# Patient Record
Sex: Female | Born: 1995 | Hispanic: No | Marital: Single | Smoking: Never smoker
Health system: Southern US, Community
[De-identification: ages and names within clinical notes are randomized; demographics above are authoritative.]

## PROBLEM LIST (undated history)

## (undated) DIAGNOSIS — F319 Bipolar disorder, unspecified: Secondary | ICD-10-CM

## (undated) HISTORY — PX: NO PAST SURGERIES: SHX2092

---

## 2019-01-27 DIAGNOSIS — F509 Eating disorder, unspecified: Secondary | ICD-10-CM | POA: Diagnosis not present

## 2019-01-27 DIAGNOSIS — F4310 Post-traumatic stress disorder, unspecified: Secondary | ICD-10-CM | POA: Diagnosis not present

## 2019-01-27 DIAGNOSIS — F3112 Bipolar disorder, current episode manic without psychotic features, moderate: Secondary | ICD-10-CM | POA: Diagnosis not present

## 2019-02-03 DIAGNOSIS — F3112 Bipolar disorder, current episode manic without psychotic features, moderate: Secondary | ICD-10-CM | POA: Diagnosis not present

## 2019-02-03 DIAGNOSIS — F4310 Post-traumatic stress disorder, unspecified: Secondary | ICD-10-CM | POA: Diagnosis not present

## 2019-02-03 DIAGNOSIS — F509 Eating disorder, unspecified: Secondary | ICD-10-CM | POA: Diagnosis not present

## 2019-02-17 DIAGNOSIS — F4310 Post-traumatic stress disorder, unspecified: Secondary | ICD-10-CM | POA: Diagnosis not present

## 2019-02-17 DIAGNOSIS — F509 Eating disorder, unspecified: Secondary | ICD-10-CM | POA: Diagnosis not present

## 2019-02-17 DIAGNOSIS — F3112 Bipolar disorder, current episode manic without psychotic features, moderate: Secondary | ICD-10-CM | POA: Diagnosis not present

## 2019-02-24 DIAGNOSIS — F4310 Post-traumatic stress disorder, unspecified: Secondary | ICD-10-CM | POA: Diagnosis not present

## 2019-02-24 DIAGNOSIS — F509 Eating disorder, unspecified: Secondary | ICD-10-CM | POA: Diagnosis not present

## 2019-02-24 DIAGNOSIS — F3112 Bipolar disorder, current episode manic without psychotic features, moderate: Secondary | ICD-10-CM | POA: Diagnosis not present

## 2019-03-14 DIAGNOSIS — Z113 Encounter for screening for infections with a predominantly sexual mode of transmission: Secondary | ICD-10-CM | POA: Diagnosis not present

## 2019-03-14 DIAGNOSIS — R102 Pelvic and perineal pain: Secondary | ICD-10-CM | POA: Diagnosis not present

## 2019-03-16 DIAGNOSIS — F3112 Bipolar disorder, current episode manic without psychotic features, moderate: Secondary | ICD-10-CM | POA: Diagnosis not present

## 2019-03-16 DIAGNOSIS — F4310 Post-traumatic stress disorder, unspecified: Secondary | ICD-10-CM | POA: Diagnosis not present

## 2019-03-16 DIAGNOSIS — F509 Eating disorder, unspecified: Secondary | ICD-10-CM | POA: Diagnosis not present

## 2019-03-17 DIAGNOSIS — F509 Eating disorder, unspecified: Secondary | ICD-10-CM | POA: Diagnosis not present

## 2019-03-17 DIAGNOSIS — F4310 Post-traumatic stress disorder, unspecified: Secondary | ICD-10-CM | POA: Diagnosis not present

## 2019-03-17 DIAGNOSIS — F3112 Bipolar disorder, current episode manic without psychotic features, moderate: Secondary | ICD-10-CM | POA: Diagnosis not present

## 2019-03-24 DIAGNOSIS — F509 Eating disorder, unspecified: Secondary | ICD-10-CM | POA: Diagnosis not present

## 2019-03-24 DIAGNOSIS — F4310 Post-traumatic stress disorder, unspecified: Secondary | ICD-10-CM | POA: Diagnosis not present

## 2019-03-24 DIAGNOSIS — F3112 Bipolar disorder, current episode manic without psychotic features, moderate: Secondary | ICD-10-CM | POA: Diagnosis not present

## 2019-03-29 DIAGNOSIS — F3112 Bipolar disorder, current episode manic without psychotic features, moderate: Secondary | ICD-10-CM | POA: Diagnosis not present

## 2019-03-29 DIAGNOSIS — F509 Eating disorder, unspecified: Secondary | ICD-10-CM | POA: Diagnosis not present

## 2019-03-29 DIAGNOSIS — F4310 Post-traumatic stress disorder, unspecified: Secondary | ICD-10-CM | POA: Diagnosis not present

## 2019-04-06 DIAGNOSIS — R52 Pain, unspecified: Secondary | ICD-10-CM | POA: Diagnosis not present

## 2019-04-28 DIAGNOSIS — F3112 Bipolar disorder, current episode manic without psychotic features, moderate: Secondary | ICD-10-CM | POA: Diagnosis not present

## 2019-04-28 DIAGNOSIS — F509 Eating disorder, unspecified: Secondary | ICD-10-CM | POA: Diagnosis not present

## 2019-04-28 DIAGNOSIS — F4310 Post-traumatic stress disorder, unspecified: Secondary | ICD-10-CM | POA: Diagnosis not present

## 2019-05-05 DIAGNOSIS — F509 Eating disorder, unspecified: Secondary | ICD-10-CM | POA: Diagnosis not present

## 2019-05-05 DIAGNOSIS — F4310 Post-traumatic stress disorder, unspecified: Secondary | ICD-10-CM | POA: Diagnosis not present

## 2019-05-05 DIAGNOSIS — F3112 Bipolar disorder, current episode manic without psychotic features, moderate: Secondary | ICD-10-CM | POA: Diagnosis not present

## 2019-05-16 DIAGNOSIS — F4310 Post-traumatic stress disorder, unspecified: Secondary | ICD-10-CM | POA: Diagnosis not present

## 2019-05-16 DIAGNOSIS — F509 Eating disorder, unspecified: Secondary | ICD-10-CM | POA: Diagnosis not present

## 2019-05-16 DIAGNOSIS — F3112 Bipolar disorder, current episode manic without psychotic features, moderate: Secondary | ICD-10-CM | POA: Diagnosis not present

## 2019-05-23 DIAGNOSIS — F3112 Bipolar disorder, current episode manic without psychotic features, moderate: Secondary | ICD-10-CM | POA: Diagnosis not present

## 2019-05-23 DIAGNOSIS — F4310 Post-traumatic stress disorder, unspecified: Secondary | ICD-10-CM | POA: Diagnosis not present

## 2019-05-23 DIAGNOSIS — F509 Eating disorder, unspecified: Secondary | ICD-10-CM | POA: Diagnosis not present

## 2019-05-30 DIAGNOSIS — F509 Eating disorder, unspecified: Secondary | ICD-10-CM | POA: Diagnosis not present

## 2019-05-30 DIAGNOSIS — F4310 Post-traumatic stress disorder, unspecified: Secondary | ICD-10-CM | POA: Diagnosis not present

## 2019-05-30 DIAGNOSIS — F3112 Bipolar disorder, current episode manic without psychotic features, moderate: Secondary | ICD-10-CM | POA: Diagnosis not present

## 2019-06-06 DIAGNOSIS — F4310 Post-traumatic stress disorder, unspecified: Secondary | ICD-10-CM | POA: Diagnosis not present

## 2019-06-06 DIAGNOSIS — F509 Eating disorder, unspecified: Secondary | ICD-10-CM | POA: Diagnosis not present

## 2019-06-06 DIAGNOSIS — F3112 Bipolar disorder, current episode manic without psychotic features, moderate: Secondary | ICD-10-CM | POA: Diagnosis not present

## 2019-07-18 DIAGNOSIS — F509 Eating disorder, unspecified: Secondary | ICD-10-CM | POA: Diagnosis not present

## 2019-07-18 DIAGNOSIS — F4310 Post-traumatic stress disorder, unspecified: Secondary | ICD-10-CM | POA: Diagnosis not present

## 2019-07-18 DIAGNOSIS — F3112 Bipolar disorder, current episode manic without psychotic features, moderate: Secondary | ICD-10-CM | POA: Diagnosis not present

## 2019-08-08 DIAGNOSIS — F3112 Bipolar disorder, current episode manic without psychotic features, moderate: Secondary | ICD-10-CM | POA: Diagnosis not present

## 2019-08-08 DIAGNOSIS — F509 Eating disorder, unspecified: Secondary | ICD-10-CM | POA: Diagnosis not present

## 2019-08-08 DIAGNOSIS — F4310 Post-traumatic stress disorder, unspecified: Secondary | ICD-10-CM | POA: Diagnosis not present

## 2019-08-14 DIAGNOSIS — F3112 Bipolar disorder, current episode manic without psychotic features, moderate: Secondary | ICD-10-CM | POA: Diagnosis not present

## 2019-08-14 DIAGNOSIS — F4310 Post-traumatic stress disorder, unspecified: Secondary | ICD-10-CM | POA: Diagnosis not present

## 2019-08-14 DIAGNOSIS — F509 Eating disorder, unspecified: Secondary | ICD-10-CM | POA: Diagnosis not present

## 2019-08-22 ENCOUNTER — Inpatient Hospital Stay (HOSPITAL_COMMUNITY): Payer: BC Managed Care – PPO

## 2019-08-22 ENCOUNTER — Inpatient Hospital Stay (HOSPITAL_COMMUNITY)
Admission: AD | Admit: 2019-08-22 | Discharge: 2019-08-22 | Disposition: A | Discharge status: Home / Self Care | Payer: BC Managed Care – PPO | Attending: Obstetrics and Gynecology | Admitting: Obstetrics and Gynecology

## 2019-08-22 ENCOUNTER — Encounter (HOSPITAL_COMMUNITY): Payer: Self-pay | Admitting: Family Medicine

## 2019-08-22 ENCOUNTER — Other Ambulatory Visit: Payer: Self-pay

## 2019-08-22 DIAGNOSIS — Z3A11 11 weeks gestation of pregnancy: Secondary | ICD-10-CM

## 2019-08-22 DIAGNOSIS — Z6790 Unspecified blood type, Rh positive: Secondary | ICD-10-CM

## 2019-08-22 DIAGNOSIS — O3680X0 Pregnancy with inconclusive fetal viability, not applicable or unspecified: Secondary | ICD-10-CM | POA: Diagnosis not present

## 2019-08-22 DIAGNOSIS — B373 Candidiasis of vulva and vagina: Secondary | ICD-10-CM | POA: Diagnosis not present

## 2019-08-22 DIAGNOSIS — B3731 Acute candidiasis of vulva and vagina: Secondary | ICD-10-CM

## 2019-08-22 DIAGNOSIS — O4691 Antepartum hemorrhage, unspecified, first trimester: Secondary | ICD-10-CM

## 2019-08-22 DIAGNOSIS — O98811 Other maternal infectious and parasitic diseases complicating pregnancy, first trimester: Secondary | ICD-10-CM | POA: Insufficient documentation

## 2019-08-22 DIAGNOSIS — O209 Hemorrhage in early pregnancy, unspecified: Secondary | ICD-10-CM | POA: Insufficient documentation

## 2019-08-22 DIAGNOSIS — O4690 Antepartum hemorrhage, unspecified, unspecified trimester: Secondary | ICD-10-CM

## 2019-08-22 HISTORY — DX: Bipolar disorder, unspecified: F31.9

## 2019-08-22 LAB — WET PREP, GENITAL
Clue Cells Wet Prep HPF POC: NONE SEEN
Sperm: NONE SEEN
Trich, Wet Prep: NONE SEEN

## 2019-08-22 LAB — CBC
HCT: 42.5 % (ref 36.0–46.0)
Hemoglobin: 14 g/dL (ref 12.0–15.0)
MCH: 30.2 pg (ref 26.0–34.0)
MCHC: 32.9 g/dL (ref 30.0–36.0)
MCV: 91.6 fL (ref 80.0–100.0)
Platelets: 339 10*3/uL (ref 150–400)
RBC: 4.64 MIL/uL (ref 3.87–5.11)
RDW: 11.7 % (ref 11.5–15.5)
WBC: 9.2 10*3/uL (ref 4.0–10.5)
nRBC: 0 % (ref 0.0–0.2)

## 2019-08-22 LAB — URINALYSIS, ROUTINE W REFLEX MICROSCOPIC
Bilirubin Urine: NEGATIVE
Glucose, UA: NEGATIVE mg/dL
Hgb urine dipstick: NEGATIVE
Ketones, ur: NEGATIVE mg/dL
Leukocytes,Ua: NEGATIVE
Nitrite: NEGATIVE
Protein, ur: NEGATIVE mg/dL
Specific Gravity, Urine: 1.014 (ref 1.005–1.030)
pH: 6 (ref 5.0–8.0)

## 2019-08-22 LAB — POCT PREGNANCY, URINE: Preg Test, Ur: POSITIVE — AB

## 2019-08-22 LAB — HCG, QUANTITATIVE, PREGNANCY: hCG, Beta Chain, Quant, S: 45 m[IU]/mL — ABNORMAL HIGH (ref <5)

## 2019-08-22 LAB — ABO/RH: ABO/RH(D): O POS

## 2019-08-22 MED ORDER — FLUCONAZOLE 150 MG PO TABS: 150.0000 mg | ORAL_TABLET | Freq: Once | ORAL | 0 refills | Status: AC

## 2019-08-22 NOTE — MAU Note (Signed)
F/u at [facility].  Had an abortion(pills) about a month ago.  Tests are still showing positive. Has had 2 [place], nothing in the uterus, but seeing fluid around.  Sent over here for eval. A couple days ago had cramping in lower abd for an hour, then it went away. Started spotting this morning, continues.

## 2019-08-22 NOTE — Discharge Instructions (Signed)
Vaginal Yeast Infection, Adult  Vaginal yeast infection is a condition that causes vaginal discharge as well as soreness, swelling, and redness (inflammation) of the vagina. This is a common condition. Some women get this infection frequently. What are the causes? This condition is caused by a change in the normal balance of the yeast (candida) and bacteria that live in the vagina. This change causes an overgrowth of yeast, which causes the inflammation. What increases the risk? The condition is more likely to develop in women who:  Take antibiotic medicines.  Have diabetes.  Take birth control pills.  Are pregnant.  Douche often.  Have a weak body defense system (immune system).  Have been taking steroid medicines for a long time.  Frequently wear tight clothing. What are the signs or symptoms? Symptoms of this condition include:  White, thick, creamy vaginal discharge.  Swelling, itching, redness, and irritation of the vagina. The lips of the vagina (vulva) may be affected as well.  Pain or a burning feeling while urinating.  Pain during sex. How is this diagnosed? This condition is diagnosed based on:  Your medical history.  A physical exam.  A pelvic exam. Your health care provider will examine a sample of your vaginal discharge under a microscope. Your health care provider may send this sample for testing to confirm the diagnosis. How is this treated? This condition is treated with medicine. Medicines may be over-the-counter or prescription. You may be told to use one or more of the following:  Medicine that is taken by mouth (orally).  Medicine that is applied as a cream (topically).  Medicine that is inserted directly into the vagina (suppository). Follow these instructions at home:  Lifestyle  Do not have sex until your health care provider approves. Tell your sex partner that you have a yeast infection. That person should go to his or her health care  provider and ask if they should also be treated.  Do not wear tight clothes, such as pantyhose or tight pants.  Wear breathable cotton underwear. General instructions  Take or apply over-the-counter and prescription medicines only as told by your health care provider.  Eat more yogurt. This may help to keep your yeast infection from returning.  Do not use tampons until your health care provider approves.  Try taking a sitz bath to help with discomfort. This is a warm water bath that is taken while you are sitting down. The water should only come up to your hips and should cover your buttocks. Do this 3-4 times per day or as told by your health care provider.  Do not douche.  If you have diabetes, keep your blood sugar levels under control.  Keep all follow-up visits as told by your health care provider. This is important. Contact a health care provider if:  You have a fever.  Your symptoms go away and then return.  Your symptoms do not get better with treatment.  Your symptoms get worse.  You have new symptoms.  You develop blisters in or around your vagina.  You have blood coming from your vagina and it is not your menstrual period.  You develop pain in your abdomen. Summary  Vaginal yeast infection is a condition that causes discharge as well as soreness, swelling, and redness (inflammation) of the vagina.  This condition is treated with medicine. Medicines may be over-the-counter or prescription.  Take or apply over-the-counter and prescription medicines only as told by your health care provider.  Do not douche.   Do not have sex or use tampons until your health care provider approves.  Contact a health care provider if your symptoms do not get better with treatment or your symptoms go away and then return. This information is not intended to replace advice given to you by your health care provider. Make sure you discuss any questions you have with your health care  provider. Document Revised: 02/24/2019 Document Reviewed: 12/13/2017 Elsevier Patient Education  2020 [employer]  

## 2019-08-22 NOTE — MAU Provider Note (Addendum)
History     CSN: [id number]  Arrival date and time: 08/22/19 1706   First Provider Initiated Contact with Patient 08/22/19 1813      Chief Complaint  Patient presents with  . Vaginal Bleeding  . Possible Pregnancy   23 y.o. G1 @11 .3 wks presenting with spotting after abortion. Reports medical abortion at 6 weeks at [facility]. She reports taking the pills on December 8 & 9th and had heavy bleeding and passed clots on the 9th and 10th. She started to have brown spotting today and was seen at [facility] again. She was told there was nothing in the uterus but there was fluid around the uterus and her UPT was still positive. She denies vaginal discharge, itching, and malodor. No abdominal pain. She has had IC since and has used condoms consistently.  OB History    Gravida  1   Para      Term      Preterm      AB      Living        SAB      TAB      Ectopic      Multiple      Live Births              Past Medical History:  Diagnosis Date  . Bipolar disorder (HCC)    Bipolar    Past Surgical History:  Procedure Laterality Date  . NO PAST SURGERIES      Family History  Problem Relation Age of Onset  . Healthy Mother   . Healthy Father     Social History   Tobacco Use  . Smoking status: Never Smoker  . Smokeless tobacco: Never Used  Substance Use Topics  . Alcohol use: Not Currently    Comment: Socially  . Drug use: Not Currently    Types: Marijuana    Allergies: No Known Allergies  Medications Prior to Admission  Medication Sig Dispense Refill Last Dose  . ferrous sulfate 325 (65 FE) MG tablet Take 325 mg by mouth once.   08/22/2019 at 1000    Review of Systems  Constitutional: Negative for chills and fever.  Gastrointestinal: Negative for abdominal pain.  Genitourinary: Positive for vaginal bleeding. Negative for vaginal discharge.   Physical Exam   Blood pressure (!) 114/58, pulse 83, temperature 98.5 F (36.9 C), temperature source  Oral, resp. rate 16, height 5\' 1"  (1.549 m), weight 58 kg, last menstrual period 06/03/2019, SpO2 100 %.  Physical Exam  Nursing note and vitals reviewed. Constitutional: She is oriented to person, place, and time. She appears well-developed and well-nourished. No distress.  HENT:  Head: Normocephalic and atraumatic.  Cardiovascular: Normal rate.  Respiratory: Effort normal. No respiratory distress.  GI: Soft. She exhibits no distension and no mass. There is no abdominal tenderness. There is no rebound and no guarding.  Genitourinary:    Genitourinary Comments: External: no lesions or erythema Vagina: rugated, pink, moist, scant brown clumpy discharge Uterus: non enlarged, retroverted, non tender, no CMT Adnexae: no masses, no tenderness left, no tenderness right Cervix closed    Musculoskeletal:        General: Normal range of motion.     Cervical back: Normal range of motion.  Neurological: She is alert and oriented to person, place, and time.  Skin: Skin is warm and dry.  Psychiatric: She has a normal mood and affect.   Results for orders placed or performed during the hospital encounter  of 08/22/19 (from the past 24 hour(s))  Pregnancy, urine POC     Status: Abnormal   Collection Time: 08/22/19  5:39 PM  Result Value Ref Range   Preg Test, Ur POSITIVE (A) NEGATIVE  Urinalysis, Routine w reflex microscopic     Status: Abnormal   Collection Time: 08/22/19  5:43 PM  Result Value Ref Range   Color, Urine STRAW (A) YELLOW   APPearance CLEAR CLEAR   Specific Gravity, Urine 1.014 1.005 - 1.030   pH 6.0 5.0 - 8.0   Glucose, UA NEGATIVE NEGATIVE mg/dL   Hgb urine dipstick NEGATIVE NEGATIVE   Bilirubin Urine NEGATIVE NEGATIVE   Ketones, ur NEGATIVE NEGATIVE mg/dL   Protein, ur NEGATIVE NEGATIVE mg/dL   Nitrite NEGATIVE NEGATIVE   Leukocytes,Ua NEGATIVE NEGATIVE  ABO/Rh     Status: None   Collection Time: 08/22/19  5:53 PM  Result Value Ref Range   ABO/RH(D) O POS    No rh  immune globuloin      NOT A RH IMMUNE GLOBULIN CANDIDATE, PT RH POSITIVE Performed at [facility], [address] [place], [place] [address]   CBC     Status: None   Collection Time: 08/22/19  5:53 PM  Result Value Ref Range   WBC 9.2 4.0 - 10.5 K/uL   RBC 4.64 3.87 - 5.11 MIL/uL   Hemoglobin 14.0 12.0 - 15.0 g/dL   HCT 48.1 85.6 - 31.4 %   MCV 91.6 80.0 - 100.0 fL   MCH 30.2 26.0 - 34.0 pg   MCHC 32.9 30.0 - 36.0 g/dL   RDW 97.0 26.3 - 78.5 %   Platelets 339 150 - 400 K/uL   nRBC 0.0 0.0 - 0.2 %  hCG, quantitative, pregnancy     Status: Abnormal   Collection Time: 08/22/19  5:53 PM  Result Value Ref Range   hCG, Beta Chain, Quant, S 45 (H) <5 mIU/mL  Wet prep, genital     Status: Abnormal   Collection Time: 08/22/19  6:23 PM   Specimen: PATH Cytology Cervicovaginal Ancillary Only  Result Value Ref Range   Yeast Wet Prep HPF POC PRESENT (A) NONE SEEN   Trich, Wet Prep NONE SEEN NONE SEEN   Clue Cells Wet Prep HPF POC NONE SEEN NONE SEEN   WBC, Wet Prep HPF POC FEW (A) NONE SEEN   Sperm NONE SEEN    US OB LESS THAN 14 WEEKS WITH OB TRANSVAGINAL  Result Date: 08/22/2019 CLINICAL DATA:  24 year old pregnant female with vaginal bleeding. LMP: 06/03/2019 corresponding to an estimated gestational age of [redacted] weeks, 3 days. EXAM: OBSTETRIC <14 WK [place] AND TRANSVAGINAL OB US TECHNIQUE: Both transabdominal and transvaginal ultrasound examinations were performed for complete evaluation of the gestation as well as the maternal uterus, adnexal regions, and pelvic cul-de-sac. Transvaginal technique was performed to assess early pregnancy. COMPARISON:  None. FINDINGS: The uterus is retroverted and appears unremarkable. The endometrium measures approximately 9 mm in thickness. No intrauterine pregnancy identified. There is some flow into the upper endometrium with an appearance of a vascular stalk. An endometrial lesion such as polyp or retained products of conception is not excluded. Clinical  correlation is recommended. The ovaries appear unremarkable. The right ovary measures 2.6 x 2.1 x 2.1 cm and the left ovary measures 3.6 x 2.2 x 1.9 cm. There is a small amount of simple appearing free fluid within the pelvis. IMPRESSION: 1. No intrauterine pregnancy identified and no adnexal masses noted. Findings consistent  with pregnancy of unknown location and differential diagnosis includes: Recent spontaneous abortion, early IUP, or an occult ectopic pregnancy. Correlation with clinical exam and follow-up with serial HCG levels and repeat ultrasound, as clinically indicated, recommended. 2. Mild vascularity to the upper endometrium. Clinical correlation is recommended to exclude retained products of conception. Electronically Signed   By: Anner Crete M.D.   On: 08/22/2019 19:13   MAU Course  Procedures  MDM Labs and [place] ordered and reviewed. Consult with Dr. Nehemiah Settle, unclear if polyp present or retained POCs, recommend repeat qhcg after 48 hrs. Will treat for yeast. Discussed findings and plan with pt. Stable for discharge home.  Assessment and Plan   1. Pregnancy of unknown anatomic location   2. Vaginal bleeding in pregnancy   3. Blood type, Rh positive   4. Yeast vaginitis    Discharge home Follow up at [facility] on 08/25/19 for rpt qhcg-message left Strict return precautions Rx Diflucan  Allergies as of 08/22/2019   No Known Allergies     Medication List    TAKE these medications   ferrous sulfate 325 (65 FE) MG tablet Take 325 mg by mouth once.   fluconazole 150 MG tablet Commonly known as: DIFLUCAN Take 1 tablet (150 mg total) by mouth once for 1 dose. May repeat dose on day [place], [place] 08/22/2019, 7:35 PM

## 2019-08-23 LAB — GC/CHLAMYDIA PROBE AMP (~~LOC~~) NOT AT ARMC
Chlamydia: NEGATIVE
Comment: NEGATIVE
Comment: NORMAL
Neisseria Gonorrhea: NEGATIVE

## 2019-08-29 DIAGNOSIS — F509 Eating disorder, unspecified: Secondary | ICD-10-CM | POA: Diagnosis not present

## 2019-08-29 DIAGNOSIS — F3112 Bipolar disorder, current episode manic without psychotic features, moderate: Secondary | ICD-10-CM | POA: Diagnosis not present

## 2019-08-29 DIAGNOSIS — F4310 Post-traumatic stress disorder, unspecified: Secondary | ICD-10-CM | POA: Diagnosis not present

## 2019-09-07 DIAGNOSIS — N760 Acute vaginitis: Secondary | ICD-10-CM | POA: Diagnosis not present

## 2019-09-07 DIAGNOSIS — L292 Pruritus vulvae: Secondary | ICD-10-CM | POA: Diagnosis not present

## 2019-09-07 DIAGNOSIS — Z113 Encounter for screening for infections with a predominantly sexual mode of transmission: Secondary | ICD-10-CM | POA: Diagnosis not present

## 2019-09-12 DIAGNOSIS — F509 Eating disorder, unspecified: Secondary | ICD-10-CM | POA: Diagnosis not present

## 2019-09-12 DIAGNOSIS — F3112 Bipolar disorder, current episode manic without psychotic features, moderate: Secondary | ICD-10-CM | POA: Diagnosis not present

## 2019-09-12 DIAGNOSIS — F4310 Post-traumatic stress disorder, unspecified: Secondary | ICD-10-CM | POA: Diagnosis not present

## 2019-09-26 DIAGNOSIS — F3112 Bipolar disorder, current episode manic without psychotic features, moderate: Secondary | ICD-10-CM | POA: Diagnosis not present

## 2019-09-26 DIAGNOSIS — F4310 Post-traumatic stress disorder, unspecified: Secondary | ICD-10-CM | POA: Diagnosis not present

## 2019-09-26 DIAGNOSIS — F509 Eating disorder, unspecified: Secondary | ICD-10-CM | POA: Diagnosis not present

## 2019-10-10 DIAGNOSIS — F509 Eating disorder, unspecified: Secondary | ICD-10-CM | POA: Diagnosis not present

## 2019-10-10 DIAGNOSIS — F3112 Bipolar disorder, current episode manic without psychotic features, moderate: Secondary | ICD-10-CM | POA: Diagnosis not present

## 2019-10-10 DIAGNOSIS — F4310 Post-traumatic stress disorder, unspecified: Secondary | ICD-10-CM | POA: Diagnosis not present

## 2019-10-19 DIAGNOSIS — F509 Eating disorder, unspecified: Secondary | ICD-10-CM | POA: Diagnosis not present

## 2019-10-19 DIAGNOSIS — F4310 Post-traumatic stress disorder, unspecified: Secondary | ICD-10-CM | POA: Diagnosis not present

## 2019-10-19 DIAGNOSIS — F3112 Bipolar disorder, current episode manic without psychotic features, moderate: Secondary | ICD-10-CM | POA: Diagnosis not present

## 2019-10-24 DIAGNOSIS — F509 Eating disorder, unspecified: Secondary | ICD-10-CM | POA: Diagnosis not present

## 2019-10-24 DIAGNOSIS — F4310 Post-traumatic stress disorder, unspecified: Secondary | ICD-10-CM | POA: Diagnosis not present

## 2019-10-24 DIAGNOSIS — F3112 Bipolar disorder, current episode manic without psychotic features, moderate: Secondary | ICD-10-CM | POA: Diagnosis not present

## 2019-11-07 DIAGNOSIS — F3112 Bipolar disorder, current episode manic without psychotic features, moderate: Secondary | ICD-10-CM | POA: Diagnosis not present

## 2019-11-07 DIAGNOSIS — F4310 Post-traumatic stress disorder, unspecified: Secondary | ICD-10-CM | POA: Diagnosis not present

## 2019-11-07 DIAGNOSIS — F509 Eating disorder, unspecified: Secondary | ICD-10-CM | POA: Diagnosis not present

## 2019-11-21 DIAGNOSIS — F509 Eating disorder, unspecified: Secondary | ICD-10-CM | POA: Diagnosis not present

## 2019-11-21 DIAGNOSIS — F3112 Bipolar disorder, current episode manic without psychotic features, moderate: Secondary | ICD-10-CM | POA: Diagnosis not present

## 2019-11-21 DIAGNOSIS — F4310 Post-traumatic stress disorder, unspecified: Secondary | ICD-10-CM | POA: Diagnosis not present

## 2019-12-05 DIAGNOSIS — F3112 Bipolar disorder, current episode manic without psychotic features, moderate: Secondary | ICD-10-CM | POA: Diagnosis not present

## 2019-12-05 DIAGNOSIS — F509 Eating disorder, unspecified: Secondary | ICD-10-CM | POA: Diagnosis not present

## 2019-12-05 DIAGNOSIS — F4310 Post-traumatic stress disorder, unspecified: Secondary | ICD-10-CM | POA: Diagnosis not present

## 2019-12-19 DIAGNOSIS — F509 Eating disorder, unspecified: Secondary | ICD-10-CM | POA: Diagnosis not present

## 2019-12-19 DIAGNOSIS — F3112 Bipolar disorder, current episode manic without psychotic features, moderate: Secondary | ICD-10-CM | POA: Diagnosis not present

## 2019-12-19 DIAGNOSIS — F4310 Post-traumatic stress disorder, unspecified: Secondary | ICD-10-CM | POA: Diagnosis not present

## 2019-12-21 DIAGNOSIS — F3112 Bipolar disorder, current episode manic without psychotic features, moderate: Secondary | ICD-10-CM | POA: Diagnosis not present

## 2019-12-21 DIAGNOSIS — F509 Eating disorder, unspecified: Secondary | ICD-10-CM | POA: Diagnosis not present

## 2019-12-21 DIAGNOSIS — F4310 Post-traumatic stress disorder, unspecified: Secondary | ICD-10-CM | POA: Diagnosis not present

## 2019-12-25 DIAGNOSIS — F3112 Bipolar disorder, current episode manic without psychotic features, moderate: Secondary | ICD-10-CM | POA: Diagnosis not present

## 2019-12-25 DIAGNOSIS — F509 Eating disorder, unspecified: Secondary | ICD-10-CM | POA: Diagnosis not present

## 2019-12-25 DIAGNOSIS — F4310 Post-traumatic stress disorder, unspecified: Secondary | ICD-10-CM | POA: Diagnosis not present

## 2020-01-11 DIAGNOSIS — F3112 Bipolar disorder, current episode manic without psychotic features, moderate: Secondary | ICD-10-CM | POA: Diagnosis not present

## 2020-01-11 DIAGNOSIS — F509 Eating disorder, unspecified: Secondary | ICD-10-CM | POA: Diagnosis not present

## 2020-01-11 DIAGNOSIS — F4310 Post-traumatic stress disorder, unspecified: Secondary | ICD-10-CM | POA: Diagnosis not present

## 2020-01-26 DIAGNOSIS — F509 Eating disorder, unspecified: Secondary | ICD-10-CM | POA: Diagnosis not present

## 2020-01-26 DIAGNOSIS — F4310 Post-traumatic stress disorder, unspecified: Secondary | ICD-10-CM | POA: Diagnosis not present

## 2020-01-26 DIAGNOSIS — F3112 Bipolar disorder, current episode manic without psychotic features, moderate: Secondary | ICD-10-CM | POA: Diagnosis not present

## 2020-02-08 DIAGNOSIS — Z419 Encounter for procedure for purposes other than remedying health state, unspecified: Secondary | ICD-10-CM | POA: Diagnosis not present

## 2020-03-10 DIAGNOSIS — Z419 Encounter for procedure for purposes other than remedying health state, unspecified: Secondary | ICD-10-CM | POA: Diagnosis not present

## 2020-04-10 DIAGNOSIS — Z419 Encounter for procedure for purposes other than remedying health state, unspecified: Secondary | ICD-10-CM | POA: Diagnosis not present

## 2020-05-10 DIAGNOSIS — Z419 Encounter for procedure for purposes other than remedying health state, unspecified: Secondary | ICD-10-CM | POA: Diagnosis not present

## 2020-06-10 DIAGNOSIS — Z419 Encounter for procedure for purposes other than remedying health state, unspecified: Secondary | ICD-10-CM | POA: Diagnosis not present

## 2020-07-10 DIAGNOSIS — Z419 Encounter for procedure for purposes other than remedying health state, unspecified: Secondary | ICD-10-CM | POA: Diagnosis not present

## 2020-08-10 DIAGNOSIS — Z419 Encounter for procedure for purposes other than remedying health state, unspecified: Secondary | ICD-10-CM | POA: Diagnosis not present

## 2020-09-10 DIAGNOSIS — Z419 Encounter for procedure for purposes other than remedying health state, unspecified: Secondary | ICD-10-CM | POA: Diagnosis not present

## 2020-10-08 DIAGNOSIS — Z419 Encounter for procedure for purposes other than remedying health state, unspecified: Secondary | ICD-10-CM | POA: Diagnosis not present

## 2020-11-08 DIAGNOSIS — Z419 Encounter for procedure for purposes other than remedying health state, unspecified: Secondary | ICD-10-CM | POA: Diagnosis not present

## 2020-12-08 DIAGNOSIS — Z419 Encounter for procedure for purposes other than remedying health state, unspecified: Secondary | ICD-10-CM | POA: Diagnosis not present

## 2021-01-08 DIAGNOSIS — Z419 Encounter for procedure for purposes other than remedying health state, unspecified: Secondary | ICD-10-CM | POA: Diagnosis not present

## 2021-02-07 DIAGNOSIS — Z419 Encounter for procedure for purposes other than remedying health state, unspecified: Secondary | ICD-10-CM | POA: Diagnosis not present

## 2021-03-10 DIAGNOSIS — Z419 Encounter for procedure for purposes other than remedying health state, unspecified: Secondary | ICD-10-CM | POA: Diagnosis not present

## 2021-04-10 DIAGNOSIS — Z419 Encounter for procedure for purposes other than remedying health state, unspecified: Secondary | ICD-10-CM | POA: Diagnosis not present

## 2021-05-10 DIAGNOSIS — Z419 Encounter for procedure for purposes other than remedying health state, unspecified: Secondary | ICD-10-CM | POA: Diagnosis not present

## 2021-06-10 DIAGNOSIS — Z419 Encounter for procedure for purposes other than remedying health state, unspecified: Secondary | ICD-10-CM | POA: Diagnosis not present

## 2021-07-10 DIAGNOSIS — Z419 Encounter for procedure for purposes other than remedying health state, unspecified: Secondary | ICD-10-CM | POA: Diagnosis not present

## 2021-08-10 DIAGNOSIS — Z419 Encounter for procedure for purposes other than remedying health state, unspecified: Secondary | ICD-10-CM | POA: Diagnosis not present

## 2021-09-10 DIAGNOSIS — Z419 Encounter for procedure for purposes other than remedying health state, unspecified: Secondary | ICD-10-CM | POA: Diagnosis not present

## 2021-10-08 DIAGNOSIS — Z419 Encounter for procedure for purposes other than remedying health state, unspecified: Secondary | ICD-10-CM | POA: Diagnosis not present

## 2021-11-08 DIAGNOSIS — Z419 Encounter for procedure for purposes other than remedying health state, unspecified: Secondary | ICD-10-CM | POA: Diagnosis not present

## 2021-12-06 IMAGING — US US OB < 14 WEEKS - US OB TV
1 series · 15 of 28 positions shown · non-contrast
Comparison: None.

CLINICAL DATA: 23-year-old pregnant female with vaginal bleeding.
LMP: 06/03/2019 corresponding to an estimated gestational age of 11
weeks, 3 days.

EXAM:
OBSTETRIC <14 WK US AND TRANSVAGINAL OB US
TECHNIQUE: Both transabdominal and transvaginal ultrasound examinations were
performed for complete evaluation of the gestation as well as the
maternal uterus, adnexal regions, and pelvic cul-de-sac.
Transvaginal technique was performed to assess early pregnancy.

[Series 1: us ob < 14 weeks - us ob tv · 15 of 57 slices shown]
[im 1/57]
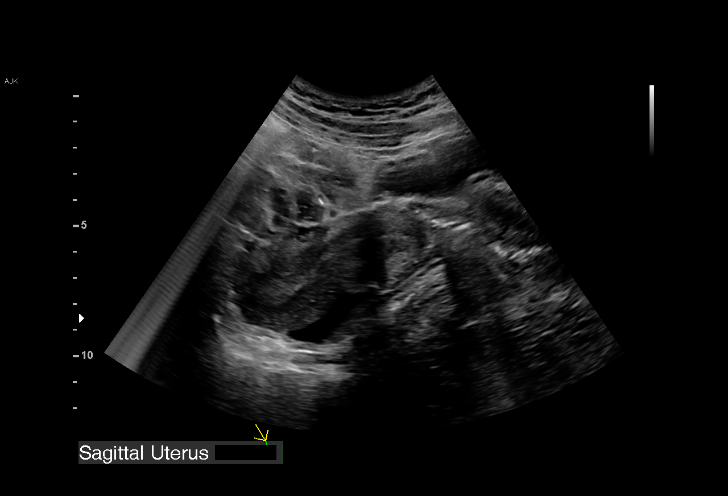
[im 5/57]
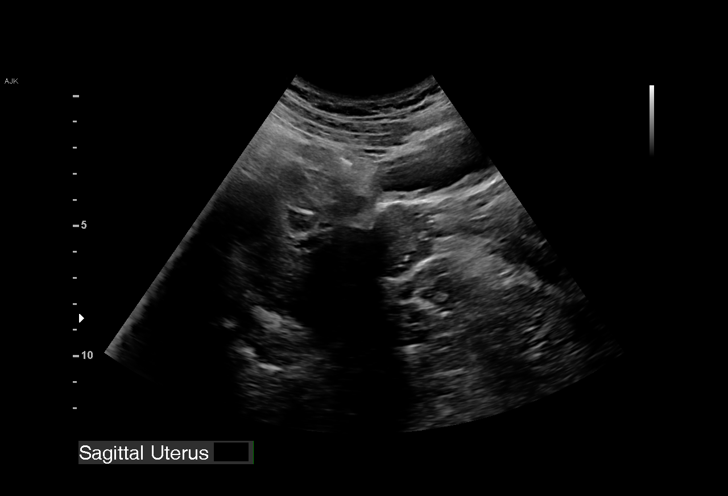
[im 9/57]
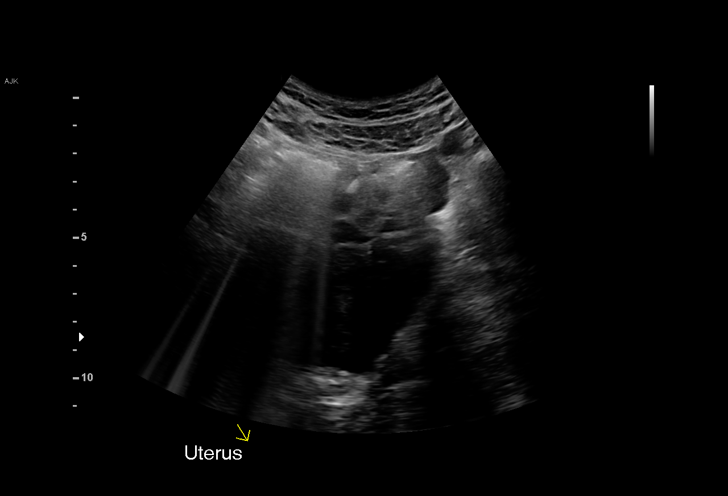
[im 13/57]
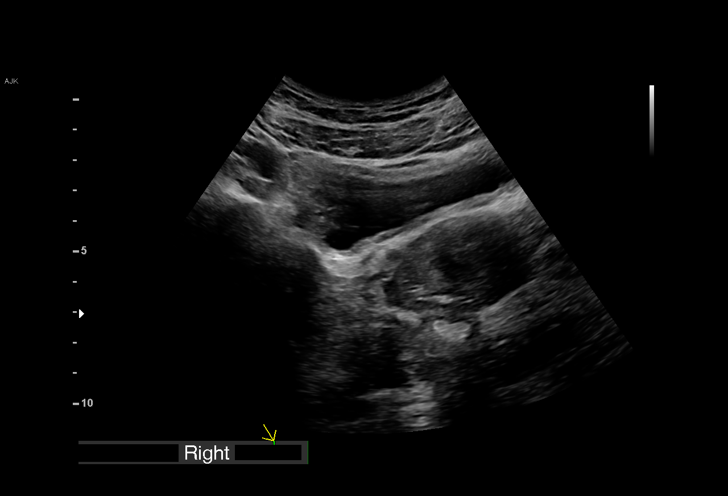
[im 17/57]
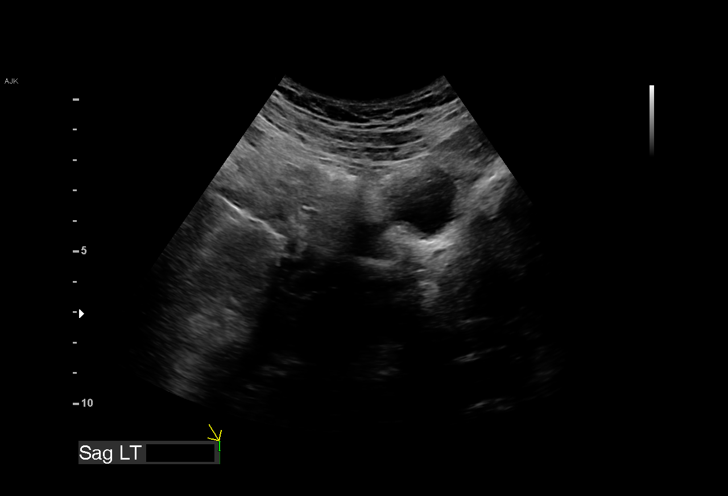
[im 21/57]
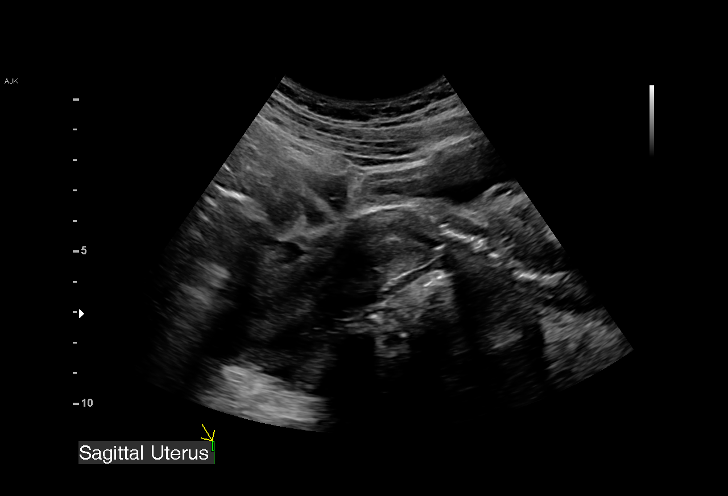
[im 25/57]
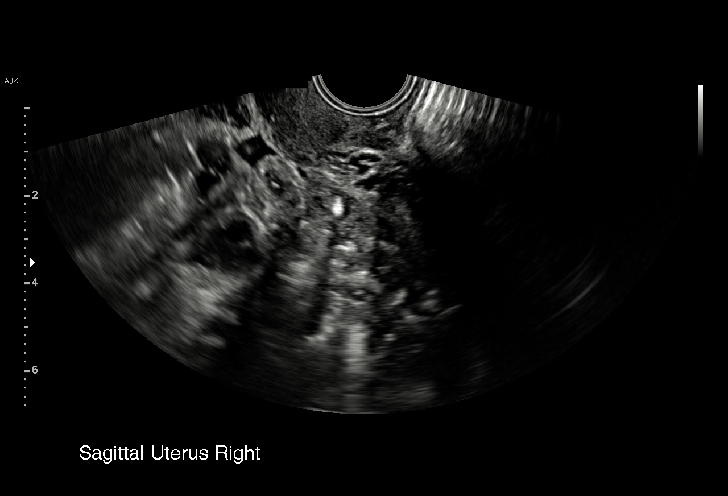
[im 30/57]
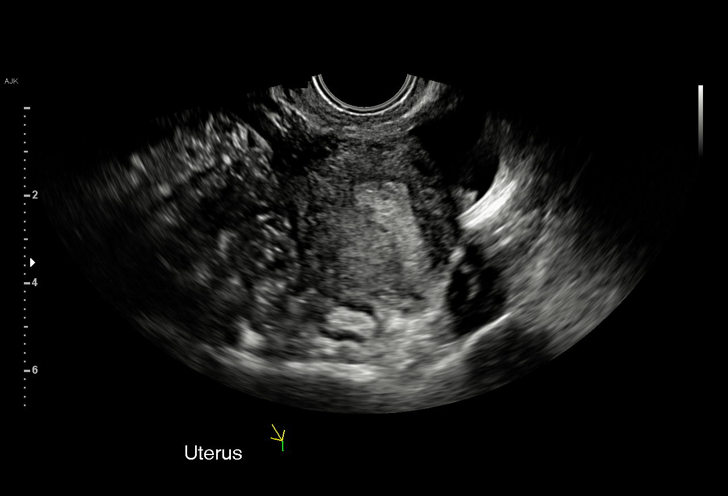
[im 32/57]
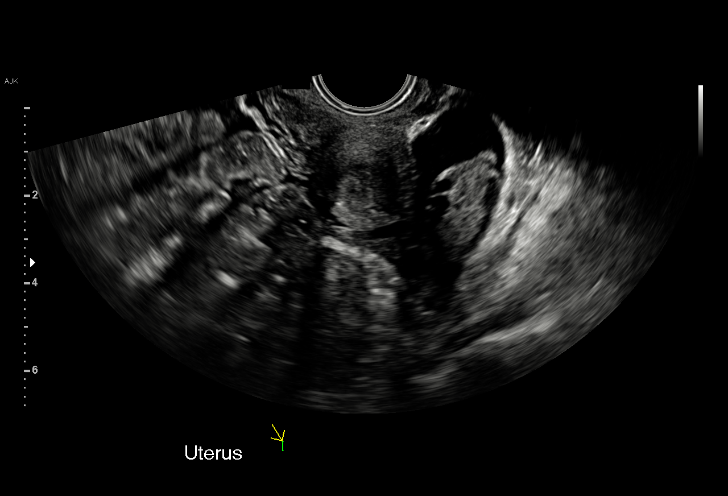
[im 36/57]
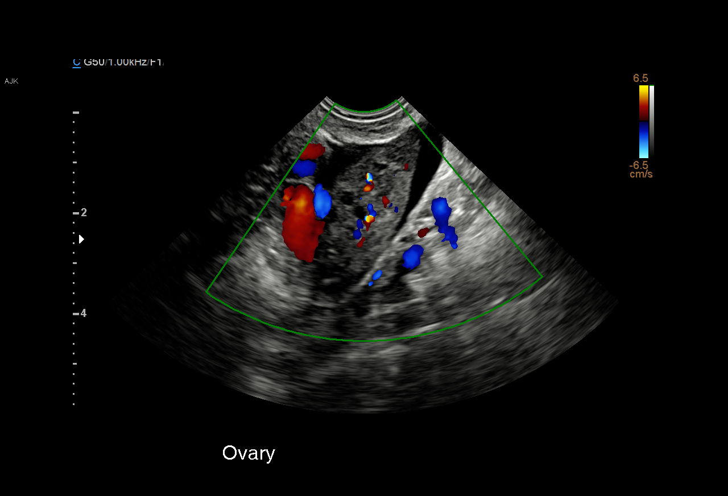
[im 40/57]
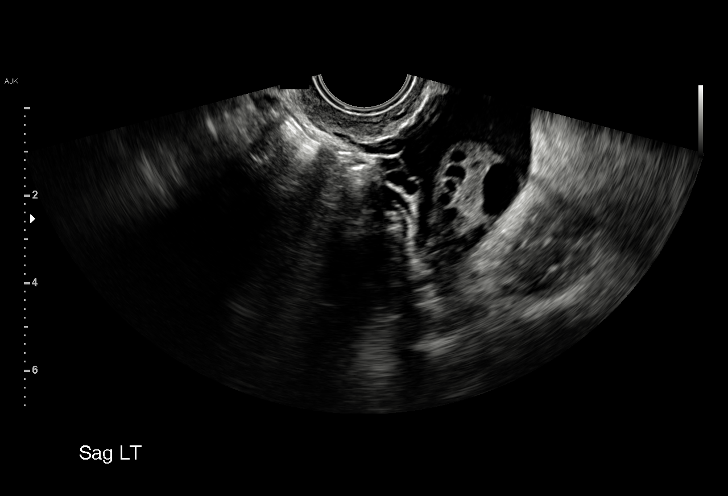
[im 44/57]
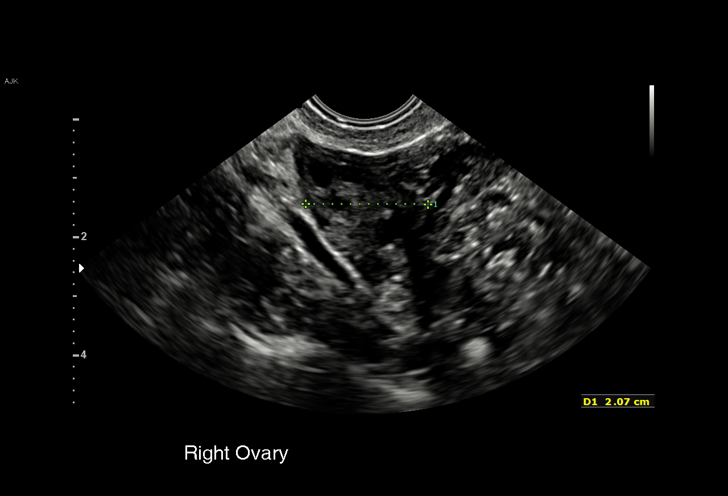
[im 48/57]
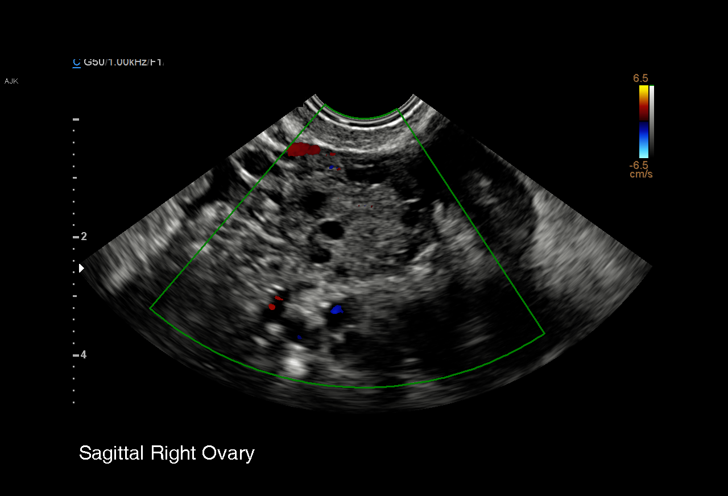
[im 52/57]
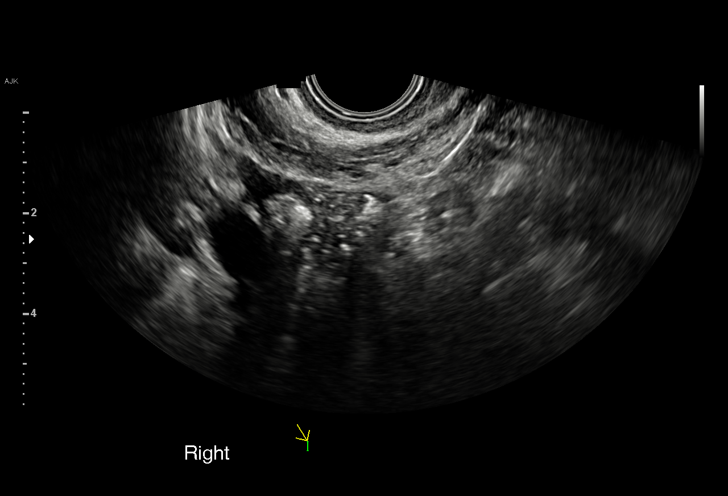
[im 57/57]
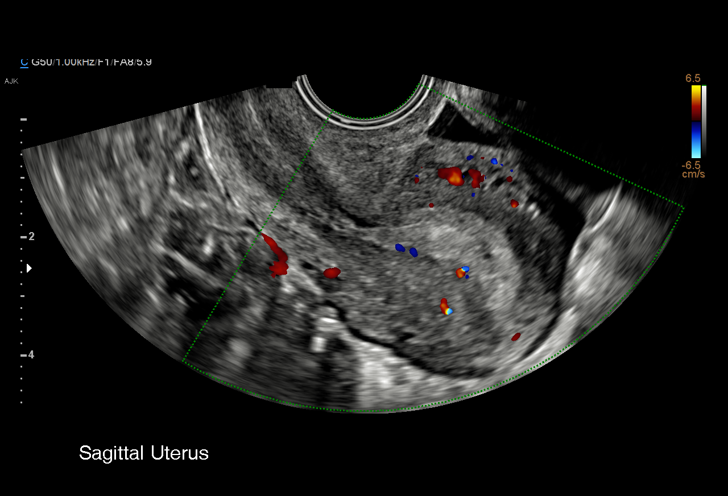

[15 of 28 positions shown; findings below may reference images not displayed]

FINDINGS: The uterus is retroverted and appears unremarkable.

The endometrium measures approximately 9 mm in thickness. No
intrauterine pregnancy identified. There is some flow into the upper
endometrium with an appearance of a vascular stalk. An endometrial
lesion such as polyp or retained products of conception is not
excluded. Clinical correlation is recommended.

The ovaries appear unremarkable. The right ovary measures 2.6 x
x 2.1 cm and the left ovary measures 3.6 x 2.2 x 1.9 cm.

There is a small amount of simple appearing free fluid within the
pelvis.
IMPRESSION: 1. No intrauterine pregnancy identified and no adnexal masses noted.
Findings consistent with pregnancy of unknown location and
differential diagnosis includes: Recent spontaneous abortion, early
IUP, or an occult ectopic pregnancy. Correlation with clinical exam
and follow-up with serial HCG levels and repeat ultrasound, as
clinically indicated, recommended.
2. Mild vascularity to the upper endometrium. Clinical correlation
is recommended to exclude retained products of conception.

## 2021-12-08 DIAGNOSIS — Z419 Encounter for procedure for purposes other than remedying health state, unspecified: Secondary | ICD-10-CM | POA: Diagnosis not present

## 2022-01-08 DIAGNOSIS — Z419 Encounter for procedure for purposes other than remedying health state, unspecified: Secondary | ICD-10-CM | POA: Diagnosis not present

## 2022-02-07 DIAGNOSIS — Z419 Encounter for procedure for purposes other than remedying health state, unspecified: Secondary | ICD-10-CM | POA: Diagnosis not present

## 2022-03-10 DIAGNOSIS — Z419 Encounter for procedure for purposes other than remedying health state, unspecified: Secondary | ICD-10-CM | POA: Diagnosis not present

## 2022-04-10 DIAGNOSIS — Z419 Encounter for procedure for purposes other than remedying health state, unspecified: Secondary | ICD-10-CM | POA: Diagnosis not present

## 2022-05-10 DIAGNOSIS — Z419 Encounter for procedure for purposes other than remedying health state, unspecified: Secondary | ICD-10-CM | POA: Diagnosis not present

## 2022-06-10 DIAGNOSIS — Z419 Encounter for procedure for purposes other than remedying health state, unspecified: Secondary | ICD-10-CM | POA: Diagnosis not present

## 2022-07-10 DIAGNOSIS — Z419 Encounter for procedure for purposes other than remedying health state, unspecified: Secondary | ICD-10-CM | POA: Diagnosis not present

## 2022-08-10 DIAGNOSIS — Z419 Encounter for procedure for purposes other than remedying health state, unspecified: Secondary | ICD-10-CM | POA: Diagnosis not present

## 2022-09-10 DIAGNOSIS — Z419 Encounter for procedure for purposes other than remedying health state, unspecified: Secondary | ICD-10-CM | POA: Diagnosis not present

## 2022-10-09 DIAGNOSIS — Z419 Encounter for procedure for purposes other than remedying health state, unspecified: Secondary | ICD-10-CM | POA: Diagnosis not present

## 2022-11-09 DIAGNOSIS — Z419 Encounter for procedure for purposes other than remedying health state, unspecified: Secondary | ICD-10-CM | POA: Diagnosis not present

## 2022-12-09 DIAGNOSIS — Z419 Encounter for procedure for purposes other than remedying health state, unspecified: Secondary | ICD-10-CM | POA: Diagnosis not present

## 2023-01-09 DIAGNOSIS — Z419 Encounter for procedure for purposes other than remedying health state, unspecified: Secondary | ICD-10-CM | POA: Diagnosis not present

## 2023-02-08 DIAGNOSIS — Z419 Encounter for procedure for purposes other than remedying health state, unspecified: Secondary | ICD-10-CM | POA: Diagnosis not present

## 2023-03-11 DIAGNOSIS — Z419 Encounter for procedure for purposes other than remedying health state, unspecified: Secondary | ICD-10-CM | POA: Diagnosis not present

## 2023-04-11 DIAGNOSIS — Z419 Encounter for procedure for purposes other than remedying health state, unspecified: Secondary | ICD-10-CM | POA: Diagnosis not present

## 2023-05-11 DIAGNOSIS — Z419 Encounter for procedure for purposes other than remedying health state, unspecified: Secondary | ICD-10-CM | POA: Diagnosis not present

## 2023-06-11 DIAGNOSIS — Z419 Encounter for procedure for purposes other than remedying health state, unspecified: Secondary | ICD-10-CM | POA: Diagnosis not present

## 2023-07-11 DIAGNOSIS — Z419 Encounter for procedure for purposes other than remedying health state, unspecified: Secondary | ICD-10-CM | POA: Diagnosis not present

## 2023-08-11 DIAGNOSIS — Z419 Encounter for procedure for purposes other than remedying health state, unspecified: Secondary | ICD-10-CM | POA: Diagnosis not present

## 2023-09-11 DIAGNOSIS — Z419 Encounter for procedure for purposes other than remedying health state, unspecified: Secondary | ICD-10-CM | POA: Diagnosis not present

## 2023-10-09 DIAGNOSIS — Z419 Encounter for procedure for purposes other than remedying health state, unspecified: Secondary | ICD-10-CM | POA: Diagnosis not present

## 2023-11-20 DIAGNOSIS — Z419 Encounter for procedure for purposes other than remedying health state, unspecified: Secondary | ICD-10-CM | POA: Diagnosis not present

## 2023-12-20 DIAGNOSIS — Z419 Encounter for procedure for purposes other than remedying health state, unspecified: Secondary | ICD-10-CM | POA: Diagnosis not present

## 2024-01-20 DIAGNOSIS — Z419 Encounter for procedure for purposes other than remedying health state, unspecified: Secondary | ICD-10-CM | POA: Diagnosis not present

## 2024-02-19 DIAGNOSIS — Z419 Encounter for procedure for purposes other than remedying health state, unspecified: Secondary | ICD-10-CM | POA: Diagnosis not present

## 2024-03-21 DIAGNOSIS — Z419 Encounter for procedure for purposes other than remedying health state, unspecified: Secondary | ICD-10-CM | POA: Diagnosis not present

## 2024-04-21 DIAGNOSIS — Z419 Encounter for procedure for purposes other than remedying health state, unspecified: Secondary | ICD-10-CM | POA: Diagnosis not present

## 2024-05-21 DIAGNOSIS — Z419 Encounter for procedure for purposes other than remedying health state, unspecified: Secondary | ICD-10-CM | POA: Diagnosis not present

## 2024-06-21 DIAGNOSIS — Z419 Encounter for procedure for purposes other than remedying health state, unspecified: Secondary | ICD-10-CM | POA: Diagnosis not present
